# Patient Record
Sex: Male | Born: 2000 | Race: Black or African American | Hispanic: No | Marital: Single | Smoking: Never smoker
Health system: Southern US, Community
[De-identification: ages and names within clinical notes are randomized; demographics above are authoritative.]

---

## 2019-02-26 ENCOUNTER — Encounter (HOSPITAL_COMMUNITY): Payer: Self-pay | Admitting: Emergency Medicine

## 2019-02-26 ENCOUNTER — Emergency Department (HOSPITAL_COMMUNITY)
Admission: EM | Admit: 2019-02-26 | Discharge: 2019-02-26 | Disposition: A | Discharge status: Home / Self Care | Payer: Self-pay | Attending: Emergency Medicine | Admitting: Emergency Medicine

## 2019-02-26 ENCOUNTER — Other Ambulatory Visit: Payer: Self-pay

## 2019-02-26 ENCOUNTER — Emergency Department (HOSPITAL_COMMUNITY): Payer: Self-pay

## 2019-02-26 DIAGNOSIS — S61412A Laceration without foreign body of left hand, initial encounter: Secondary | ICD-10-CM | POA: Insufficient documentation

## 2019-02-26 DIAGNOSIS — Y9389 Activity, other specified: Secondary | ICD-10-CM | POA: Insufficient documentation

## 2019-02-26 DIAGNOSIS — Y92410 Unspecified street and highway as the place of occurrence of the external cause: Secondary | ICD-10-CM | POA: Insufficient documentation

## 2019-02-26 DIAGNOSIS — Y999 Unspecified external cause status: Secondary | ICD-10-CM | POA: Insufficient documentation

## 2019-02-26 DIAGNOSIS — W19XXXA Unspecified fall, initial encounter: Secondary | ICD-10-CM

## 2019-02-26 DIAGNOSIS — S50811A Abrasion of right forearm, initial encounter: Secondary | ICD-10-CM | POA: Insufficient documentation

## 2019-02-26 DIAGNOSIS — T1490XA Injury, unspecified, initial encounter: Secondary | ICD-10-CM

## 2019-02-26 DIAGNOSIS — Z23 Encounter for immunization: Secondary | ICD-10-CM | POA: Insufficient documentation

## 2019-02-26 DIAGNOSIS — T148XXA Other injury of unspecified body region, initial encounter: Secondary | ICD-10-CM | POA: Insufficient documentation

## 2019-02-26 MED ORDER — TETANUS-DIPHTH-ACELL PERTUSSIS 5-2.5-18.5 LF-MCG/0.5 IM SUSP
0.5000 mL | Freq: Once | INTRAMUSCULAR | Status: AC
  Administered 2019-02-26: 0.5 mL via INTRAMUSCULAR
  Filled 2019-02-26: qty 0.5

## 2019-02-26 MED ORDER — CEFAZOLIN SODIUM-DEXTROSE 1-4 GM/50ML-% IV SOLN
1.0000 g | Freq: Once | INTRAVENOUS | Status: AC
  Administered 2019-02-26: 1 g via INTRAVENOUS
  Filled 2019-02-26: qty 50

## 2019-02-26 MED ORDER — LIDOCAINE-EPINEPHRINE (PF) 2 %-1:200000 IJ SOLN
10.0000 mL | Freq: Once | INTRAMUSCULAR | Status: AC
  Administered 2019-02-26: 10 mL
  Filled 2019-02-26: qty 20
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
## 2019-02-26 NOTE — ED Provider Notes (Signed)
..  Laceration Repair Date/Time: 02/26/2019 9:40 PM Performed by: Jeannie Fend, PA-C Authorized by: Jeannie Fend, PA-C   Consent:    Consent obtained:  Verbal   Consent given by:  Patient   Risks discussed:  Infection, need for additional repair, pain, poor cosmetic result and poor wound healing   Alternatives discussed:  No treatment and delayed treatment Universal protocol:    Procedure explained and questions answered to patient or proxy's satisfaction: yes     Relevant documents present and verified: yes     Test results available and properly labeled: yes     Imaging studies available: yes     Required blood products, implants, devices, and special equipment available: yes     Site/side marked: yes     Immediately prior to procedure, a time out was called: yes     Patient identity confirmed:  Verbally with patient Anesthesia (see MAR for exact dosages):    Anesthesia method:  Local infiltration   Local anesthetic:  Lidocaine 2% WITH epi Laceration details:    Location:  Hand   Hand location:  L palm   Length (cm):  3   Depth (mm):  5 Repair type:    Repair type:  Simple Pre-procedure details:    Preparation:  Patient was prepped and draped in usual sterile fashion and imaging obtained to evaluate for foreign bodies Exploration:    Hemostasis achieved with:  Epinephrine   Wound exploration: wound explored through full range of motion and entire depth of wound probed and visualized     Wound extent: no foreign bodies/material noted, no muscle damage noted and no underlying fracture noted     Contaminated: no   Treatment:    Area cleansed with:  Saline   Amount of cleaning:  Extensive   Irrigation solution:  Sterile saline Skin repair:    Repair method:  Sutures   Suture size:  4-0   Suture material:  Nylon   Suture technique:  Simple interrupted   Number of sutures:  6 Approximation:    Approximation:  Close Post-procedure details:    Patient tolerance of  procedure:  Tolerated well, no immediate complications      Alden Hipp 02/26/19 2141    Gerhard Munch, MD 02/27/19 [phone]

## 2019-02-26 NOTE — ED Notes (Signed)
Pt removed his own IV, seen walking out of ED

## 2019-02-26 NOTE — ED Notes (Signed)
ED Provider at bedside. 
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
